# Patient Record
Sex: Male | Born: 1940 | Race: Black or African American | Hispanic: No | Marital: Married | Smoking: Former smoker
Health system: Southern US, Community
[De-identification: ages and names within clinical notes are randomized; demographics above are authoritative.]

## PROBLEM LIST (undated history)

## (undated) DIAGNOSIS — K4090 Unilateral inguinal hernia, without obstruction or gangrene, not specified as recurrent: Secondary | ICD-10-CM

## (undated) DIAGNOSIS — I10 Essential (primary) hypertension: Secondary | ICD-10-CM

## (undated) DIAGNOSIS — J449 Chronic obstructive pulmonary disease, unspecified: Secondary | ICD-10-CM

---

## 2005-12-08 ENCOUNTER — Ambulatory Visit: Payer: Self-pay | Admitting: Gastroenterology

## 2008-08-18 ENCOUNTER — Ambulatory Visit: Payer: Self-pay | Admitting: Family Medicine

## 2009-04-09 ENCOUNTER — Emergency Department: Payer: Self-pay | Admitting: Emergency Medicine

## 2012-05-23 HISTORY — PX: HERNIA REPAIR: SHX51

## 2012-11-29 ENCOUNTER — Ambulatory Visit: Payer: Self-pay | Admitting: Surgery

## 2012-12-06 ENCOUNTER — Ambulatory Visit: Payer: Self-pay | Admitting: Surgery

## 2014-09-12 NOTE — Op Note (Signed)
PATIENT NAME:  Gerald Graham, [name] MR#:  [id number] DATE OF BIRTH:  05/10/1941  DATE OF PROCEDURE:  12/06/2012  PREOPERATIVE DIAGNOSIS: Left inguinal hernia.   POSTOPERATIVE DIAGNOSIS: Left inguinal hernia.   PROCEDURE: Left inguinal hernia repair.   SURGEON:  Renda RollsWilton Henery Betzold, M.D.   ANESTHESIA: General.   INDICATIONS: This 74 year old male has a history of bulging in the left groin with increasing size. A large left inguinal scrotal hernia was demonstrated on physical exam and repair was recommended for definitive treatment.   DESCRIPTION OF PROCEDURE: The patient was placed on the operating table in the supine position under general anesthesia. The hernia was identified and was manually reduced, clippers were used. The left lower quadrant was repaired with ChloraPrep and draped in a sterile manner. A transversely oriented and left lower quadrant suprapubic incision was made, carried down through subcutaneous tissues. One bleeding point was suture ligated with 4-0 chromic. Several small bleeding points were cauterized. Scarpa's fascia was incised. The external oblique aponeurosis was incised along the course of its fibers to open the external ring and expose the inguinal cord structures. There was a large direct inguinal hernia sac which was dissected free from surrounding structures. The cremaster fibers were spread, exposing the sac and it was a somewhat tedious dissection but  the sac was approximately 5 inches in length and was dissected down through the fascial ring defect. This was just below and medial to the inferior epigastric vessels. The vessels were dissected away from the sac. The sac was opened. Its continuity with the peritoneal cavity was demonstrated. Subsequently, a high ligation of the sac was done with a 0 Surgilon ligature and suture ligature as it was triply ligated. The sac was excised. The stump was allowed to retract. Two small cord lipomas were dissected free from surrounding  structures and ligated and amputated using 4-0 Vicryl. The cord structures were further noted. There was no indirect component. Next, the repair was carried out with a row of 0 Surgilon sutures, beginning at the pubic tubercle, suturing the conjoined tendon to the shelving edge of the inguinal ligament incorporating transversalis fascia into the repair. The last stitch led to satisfactory narrowing of the internal ring. Next, an onlay Atrium mesh was cut to create an oval shape of approximately 2.8 x 4 cm and a notch was cut out to straddle the cord structures. This was placed along the floor of the inguinal canal, was sutured to the repair with 0 Surgilon.  It is also noted a relaxing incision was made medially and the mesh was sutured to the medial aspect of the relaxing incision.  It was sutured on both sides of the internal ring. The repair looked good. Hemostasis was intact. The cord structures were replaced along the floor of the inguinal canal. Cut edges of the external oblique aponeurosis were closed with running 4-0 Vicryl to recreate the external ring. The deep fascia superior and lateral to the repair site was infiltrated with 10 mL of 0.5% Sensorcaine with epinephrine. Subcutaneous tissues were infiltrated with 5 mL. Scarpa's fascia was closed with interrupted 4-0 Vicryl. The skin was closed with running 4-0 Monocryl subcuticular suture and Dermabond. The testicle remained in the scrotum. The patient tolerated the procedure satisfactorily and was then prepared for transfer to the recovery room.   ____________________________ Shela CommonsJ. Renda RollsWilton Aleeha Boline, MD jws:rw D: 12/06/2012 12:03:05 ET T: 12/06/2012 13:21:16 ET JOB#: [id number]  cc: Adella HareJ. Wilton Tarhonda Hollenberg, MD, <Dictator> Adella HareWILTON J Zienna Ahlin MD ELECTRONICALLY SIGNED 12/07/2012 9:40

## 2016-10-24 DIAGNOSIS — R0602 Shortness of breath: Secondary | ICD-10-CM | POA: Insufficient documentation

## 2017-02-20 DIAGNOSIS — K4090 Unilateral inguinal hernia, without obstruction or gangrene, not specified as recurrent: Secondary | ICD-10-CM

## 2017-02-20 HISTORY — DX: Unilateral inguinal hernia, without obstruction or gangrene, not specified as recurrent: K40.90

## 2017-03-09 ENCOUNTER — Encounter: Payer: Self-pay | Admitting: General Surgery

## 2017-03-09 ENCOUNTER — Ambulatory Visit (INDEPENDENT_AMBULATORY_CARE_PROVIDER_SITE_OTHER): Payer: Medicare Other | Admitting: General Surgery

## 2017-03-09 VITALS — BP 187/73 | HR 71 | Temp 97.8°F | Ht 63.0 in | Wt 151.4 lb

## 2017-03-09 DIAGNOSIS — K4090 Unilateral inguinal hernia, without obstruction or gangrene, not specified as recurrent: Secondary | ICD-10-CM

## 2017-03-09 NOTE — Progress Notes (Signed)
Patient ID: Gerald Graham, male   DOB: 1941/02/01, 76 y.o.   MRN: [id number]  CC: Right groin bulge  HPI Gerald Graham is a 76 y.o. male ho presents to clinic today for evaluation of a right groin bulge. Patient reports he had a near identical bulge 4 years ago that was repaired by Dr. Katrinka Blazing. He first noted this bulge about 3 months ago. It has gradually got larger and more symptomatic. He feels it whenever he is very active or strains to lift something. He states he has to hold of them when he sits down for bowel movement. It has always been soft. He denies any fevers, chills, nausea, vomiting, chest pain, shortness of breath, diarrhea, constipation. He is otherwise in his usual state of health.  HPI  Past medical history: COPD  Past surgical history: Left inguinal hernia  Past family history: Father with coronary artery disease, no other known history of heart disease, cancer, diabetes.  Social History Social History  Substance Use Topics  . Smoking status: Current Every Day Smoker    Packs/day: 0.25  . Smokeless tobacco: Never Used  . Alcohol use No    No Known Allergies  Current Outpatient Prescriptions  Medication Sig Dispense Refill  . albuterol (PROAIR HFA) 108 (90 Base) MCG/ACT inhaler 2 inhalations every 6 (six) hours as needed.    Marland Kitchen aspirin EC 81 MG tablet Take by mouth.    Marland Kitchen atorvastatin (LIPITOR) 40 MG tablet 1 tablet nightly.    Marland Kitchen doxazosin (CARDURA) 1 MG tablet Take by mouth.    Marland Kitchen lisinopril (PRINIVIL,ZESTRIL) 40 MG tablet Take by mouth.     No current facility-administered medications for this visit.      Review of Systems A multi-point review of systems was asked and was negative except for the findings documented in the history of present illness  Physical Exam Blood pressure (!) 187/73, pulse 71, temperature 97.8 F (36.6 C), temperature source Oral, height 5\' 3"  (1.6 m), weight 68.7 kg (151 lb 6.4 oz). CONSTITUTIONAL: no acute distress. EYES: Pupils are  equal, round, and reactive to light, Sclera are non-icteric. EARS, NOSE, MOUTH AND THROAT: The oropharynx is clear. The oral mucosa is pink and moist. Hearing is intact to voice. LYMPH NODES:  Lymph nodes in the neck are normal. RESPIRATORY:  Lungs are clear. There is normal respiratory effort, with equal breath sounds bilaterally, and without pathologic use of accessory muscles. CARDIOVASCULAR: Heart is regular without murmurs, gallops, or rubs. GI: The abdomen is soft, nontender, and nondistended. There is an obvious bulge to the right groin that is soft and reducible consistent with a right inguinal hernia, there are no obvious visible scars from prior surgeries.. There is no hepatosplenomegaly. There are normal bowel sounds in all quadrants. GU: Rectal deferred.   MUSCULOSKELETAL: Normal muscle strength and tone. No cyanosis or edema.   SKIN: Turgor is good and there are no pathologic skin lesions or ulcers. NEUROLOGIC: Motor and sensation is grossly normal. Cranial nerves are grossly intact. PSYCH:  Oriented to person, place and time. Affect is normal.  Data Reviewed No images and labs to review I have personally reviewed the patient's imaging, laboratory findings and medical records.    Assessment    Right inguinal hernia    Plan    76 year old male with a right inguinal hernia. Although the chart says he had a prior left inguinal hernia repair he reports that the hernias on the same side. Discussed the diagnosis in detail  as well as the treatment options of open versus laparoscopic repair. Discussed given his history of lung disease as well as the size and nature of the hernia that an open repair would probably be easier for him to handle the laparoscopic repair. Discussed the procedure in detail to include the risks, benefits, alternatives. Patient voiced understanding and desires to proceed. We will plan to proceed to the operating room on Tuesday, November 2.     Time spent with  the patient was 45 minutes, with more than 50% of the time spent in face-to-face education, counseling and care coordination.     Ricarda Frameharles Aqua Denslow, MD FACS General Surgeon 03/09/2017, 11:37 AM

## 2017-03-09 NOTE — Patient Instructions (Addendum)
You have chose to have your hernia repaired. This will be done by Dr. Tonita CongWoodham on 03/24/2017 [facility].  Please see your (blue) Pre-care information that you have been given today.  You will need to arrange to be out of work for 2 weeks and then return with a lifting restrictions for 4 more weeks. Please send any FMLA paperwork prior to surgery and we will fill this out and fax it back to your employer within 3 business days.  You may have a bruise in your groin and also swelling and brusing in your testicle area. You may use ice 4-5 times daily for 15-20 minutes each time. Make sure that you place a barrier between you and the ice pack. To decrease the swelling, you may roll up a bath towel and place it vertically in between your thighs with your testicles resting on the towel. You will want to keep this area elevated as much as possible for several days following surgery.    Inguinal Hernia, Adult Muscles help keep everything in the body in its proper place. But if a weak spot in the muscles develops, something can poke through. That is called a hernia. When this happens in the lower part of the belly (abdomen), it is called an inguinal hernia. (It takes its name from a part of the body in this region called the inguinal canal.) A weak spot in the wall of muscles lets some fat or part of the small intestine bulge through. An inguinal hernia can develop at any age. Men get them more often than women. CAUSES  In adults, an inguinal hernia develops over time.  It can be triggered by:  Suddenly straining the muscles of the lower abdomen.  Lifting heavy objects.  Straining to have a bowel movement. Difficult bowel movements (constipation) can lead to this.  Constant coughing. This may be caused by smoking or lung disease.  Being overweight.  Being pregnant.  Working at a job that requires long periods of standing or heavy lifting.  Having had an inguinal hernia before. One type can be an  emergency situation. It is called a strangulated inguinal hernia. It develops if part of the small intestine slips through the weak spot and cannot get back into the abdomen. The blood supply can be cut off. If that happens, part of the intestine may die. This situation requires emergency surgery. SYMPTOMS  Often, a small inguinal hernia has no symptoms. It is found when a healthcare provider does a physical exam. Larger hernias usually have symptoms.   In adults, symptoms may include:  A lump in the groin. This is easier to see when the person is standing. It might disappear when lying down.  In men, a lump in the scrotum.  Pain or burning in the groin. This occurs especially when lifting, straining or coughing.  A dull ache or feeling of pressure in the groin.  Signs of a strangulated hernia can include:  A bulge in the groin that becomes very painful and tender to the touch.  A bulge that turns red or purple.  Fever, nausea and vomiting.  Inability to have a bowel movement or to pass gas. DIAGNOSIS  To decide if you have an inguinal hernia, a healthcare provider will probably do a physical examination.  This will include asking questions about any symptoms you have noticed.  The healthcare provider might feel the groin area and ask you to cough. If an inguinal hernia is felt, the healthcare provider  may try to slide it back into the abdomen.  Usually no other tests are needed. TREATMENT  Treatments can vary. The size of the hernia makes a difference. Options include:  Watchful waiting. This is often suggested if the hernia is small and you have had no symptoms.  No medical procedure will be done unless symptoms develop.  You will need to watch closely for symptoms. If any occur, contact your healthcare provider right away.  Surgery. This is used if the hernia is larger or you have symptoms.  Open surgery. This is usually an outpatient procedure (you will not stay  overnight in a hospital). An cut (incision) is made through the skin in the groin. The hernia is put back inside the abdomen. The weak area in the muscles is then repaired by herniorrhaphy or hernioplasty. Herniorrhaphy: in this type of surgery, the weak muscles are sewn back together. Hernioplasty: a patch or mesh is used to close the weak area in the abdominal wall.  Laparoscopy. In this procedure, a surgeon makes small incisions. A thin tube with a tiny video camera (called a laparoscope) is put into the abdomen. The surgeon repairs the hernia with mesh by looking with the video camera and using two long instruments. HOME CARE INSTRUCTIONS   After surgery to repair an inguinal hernia:  You will need to take pain medicine prescribed by your healthcare provider. Follow all directions carefully.  You will need to take care of the wound from the incision.  Your activity will be restricted for awhile. This will probably include no heavy lifting for several weeks. You also should not do anything too active for a few weeks. When you can return to work will depend on the type of job that you have.  During "watchful waiting" periods, you should:  Maintain a healthy weight.  Eat a diet high in fiber (fruits, vegetables and whole grains).  Drink plenty of fluids to avoid constipation. This means drinking enough water and other liquids to keep your urine clear or pale yellow.  Do not lift heavy objects.  Do not stand for long periods of time.  Quit smoking. This should keep you from developing a frequent cough. SEEK MEDICAL CARE IF:   A bulge develops in your groin area.  You feel pain, a burning sensation or pressure in the groin. This might be worse if you are lifting or straining.  You develop a fever of more than 100.5 F (38.1 C). SEEK IMMEDIATE MEDICAL CARE IF:   Pain in the groin increases suddenly.  A bulge in the groin gets bigger suddenly and does not go down.  For men, there  is sudden pain in the scrotum. Or, the size of the scrotum increases.  A bulge in the groin area becomes red or purple and is painful to touch.  You have nausea or vomiting that does not go away.  You feel your heart beating much faster than normal.  You cannot have a bowel movement or pass gas.  You develop a fever of more than 102.0 F (38.9 C).   This information is not intended to replace advice given to you by your health care provider. Make sure you discuss any questions you have with your health care provider.   Document Released: 09/25/2008 Document Revised: 08/01/2011 Document Reviewed: 11/10/2014 Elsevier Interactive Patient Education [employer].

## 2017-03-13 ENCOUNTER — Telehealth: Payer: Self-pay | Admitting: General Surgery

## 2017-03-13 NOTE — Telephone Encounter (Signed)
Pt advised of pre op date/time and sx date. Sx: 03/24/17 with Dr Sedonia SmallWoodham--open right inguinal hernia repair.  Pre op: 03/14/17 @ 1:45pm--Office interview.   Patient made aware to call [phone], between 1-3:00pm the day before surgery, to find out what time to arrive.

## 2017-03-14 ENCOUNTER — Ambulatory Visit
Admission: RE | Admit: 2017-03-14 | Discharge: 2017-03-14 | Disposition: A | Discharge status: Home / Self Care | Payer: Medicare Other | Source: Ambulatory Visit | Attending: General Surgery | Admitting: General Surgery

## 2017-03-14 ENCOUNTER — Encounter
Admission: RE | Admit: 2017-03-14 | Discharge: 2017-03-14 | Disposition: A | Discharge status: Home / Self Care | Payer: Medicare Other | Source: Ambulatory Visit | Attending: General Surgery | Admitting: General Surgery

## 2017-03-14 DIAGNOSIS — Z01810 Encounter for preprocedural cardiovascular examination: Secondary | ICD-10-CM | POA: Diagnosis not present

## 2017-03-14 DIAGNOSIS — R9431 Abnormal electrocardiogram [ECG] [EKG]: Secondary | ICD-10-CM | POA: Diagnosis not present

## 2017-03-14 DIAGNOSIS — Z01818 Encounter for other preprocedural examination: Secondary | ICD-10-CM | POA: Insufficient documentation

## 2017-03-14 DIAGNOSIS — K4090 Unilateral inguinal hernia, without obstruction or gangrene, not specified as recurrent: Secondary | ICD-10-CM | POA: Insufficient documentation

## 2017-03-14 HISTORY — DX: Unilateral inguinal hernia, without obstruction or gangrene, not specified as recurrent: K40.90

## 2017-03-14 HISTORY — DX: Chronic obstructive pulmonary disease, unspecified: J44.9

## 2017-03-14 HISTORY — DX: Essential (primary) hypertension: I10

## 2017-03-14 LAB — CBC
HCT: 43 % (ref 40.0–52.0)
Hemoglobin: 14.3 g/dL (ref 13.0–18.0)
MCH: 28.1 pg (ref 26.0–34.0)
MCHC: 33.2 g/dL (ref 32.0–36.0)
MCV: 84.8 fL (ref 80.0–100.0)
PLATELETS: 166 10*3/uL (ref 150–440)
RBC: 5.08 MIL/uL (ref 4.40–5.90)
RDW: 13.8 % (ref 11.5–14.5)
WBC: 5.6 10*3/uL (ref 3.8–10.6)

## 2017-03-14 NOTE — Patient Instructions (Signed)
Your procedure is scheduled on: March 24, 2017  Report to THE [facility], 2ND FLOOR  To find out your arrival time please call [phone] between 1PM - 3PM on Thursday, March 23, 2017  Remember: Instructions that are not followed completely may result in serious medical risk, up to and including death, or upon the discretion of your surgeon and anesthesiologist your surgery may need to be rescheduled.     _X__ 1. Do not eat food after midnight the night before your procedure.                 No gum chewing or hard candies. You may drink clear liquids up to 2 hours                 before you are scheduled to arrive for your surgery- DO not drink clear                 liquids within 2 hours of the start of your surgery.                 Clear Liquids include:  water, apple juice without pulp, clear carbohydrate                 drink such as Clearfast of Gartorade, Black Coffee or Tea (Do not add                 anything to coffee or tea).     _X__ 2.  No Alcohol for 24 hours before or after surgery.   _X__ 3.  Do Not Smoke or use e-cigarettes For 24 Hours Prior to Your Surgery.                 Do not use any chewable tobacco products for at least 6 hours prior to                 surgery.  ____  4.  Bring all medications with you on the day of surgery if instructed.   _x___  5.  Notify your doctor if there is any change in your medical condition      (cold, fever, infections).     Do not wear jewelry, make-up, hairpins, clips or nail polish. Do not wear lotions, powders, or perfumes. You may wear deodorant. Do not shave 48 hours prior to surgery. Men may shave face and neck. Do not bring valuables to the hospital.    [facility] not [facility] any belongings or valuables.  Contacts, dentures or bridgework may not be worn into surgery. Leave your suitcase in the car. After surgery it may be brought to your room. For patients admitted to the  hospital, discharge time is determined by your treatment team.   Patients discharged the day of surgery will not be allowed to drive home.   Please read over the following fact sheets that you were given:   PREPARING FOR SURGERY   __X__ Take these medicines the morning of surgery with A SIP OF WATER:    1. ALBUTEROL INHALER  2. CARDURA  3.   4.  5.  6.  ____ Fleet Enema (as directed)   __X__ Use CHG Soap as directed  __X__ Use inhalers on the day of surgery  ____ Stop metformin 2 days prior to surgery    ____ Take 1/2 of usual insulin dose the night before surgery. No insulin the morning  of surgery.   __x__ Stop Coumadin/Plavix/ASPIRIN on Thursday, October 25,2018  __x__ Stop Anti-inflammatories on Thursday, October 25th.  This includes ibuprofen/motrin/aleve/advil/naproxen   ____ Stop supplements until after surgery.    ____ Bring C-Pap to the hospital.   CONTINUE TO TAKE LISINOPRIL BUT DO NOT TAKE ON THE MORNING OF SURGERY.  IF YOU COMPLETE AN ADVANCE DIRECTIVE SUCH AS HEALTHCARE POA OR LIVING WILL, PLEASE BRING IT WITH YOU SO WE MAY MAKE A COPY FOR YOUR CHART.  PURCHASE ANY KIND OF STOOL SOFTENERS SO YOU MAY BEGIN TAKING AS SOON AS YOU START NARCOTIC PAIN MEDICINE AFTER SURGERY.

## 2017-03-14 NOTE — Pre-Procedure Instructions (Signed)
Patient recently had a visit with pulmonologist (Dr. Mayo AoFlemming) who had stated that his breathing had improved, even with continued breathing. Patient denies any difficulties with his respiratory status and uses his inhaler as needed. Chest xray to be completed today prior to surgery.

## 2017-03-14 NOTE — Pre-Procedure Instructions (Signed)
Patient had stated that he had previously had a right inguinal hernia repair a few years ago but after questioning him, he is not sure of this. His wife thinks it was on his left side as all the records indicate.

## 2017-03-15 NOTE — Pre-Procedure Instructions (Signed)
CONSULTED WITH DR A KARENZ RE COPD WITH [facility]. ALSO EKG COMPARED WITH PREVIOUS AND OK BY DR Karlton LemonKARENZ

## 2017-03-23 MED ORDER — CEFAZOLIN SODIUM-DEXTROSE 2-4 GM/100ML-% IV SOLN
2.0000 g | INTRAVENOUS | Status: AC
  Administered 2017-03-24: 2 g via INTRAVENOUS

## 2017-03-24 ENCOUNTER — Ambulatory Visit
Admission: RE | Admit: 2017-03-24 | Discharge: 2017-03-24 | Disposition: A | Discharge status: Home / Self Care | Payer: Medicare Other | Source: Ambulatory Visit | Attending: General Surgery | Admitting: General Surgery

## 2017-03-24 ENCOUNTER — Encounter
Admission: RE | Disposition: A | Discharge status: Home / Self Care | Payer: Self-pay | Source: Ambulatory Visit | Attending: General Surgery

## 2017-03-24 ENCOUNTER — Ambulatory Visit: Payer: Medicare Other | Admitting: Anesthesiology

## 2017-03-24 DIAGNOSIS — F17210 Nicotine dependence, cigarettes, uncomplicated: Secondary | ICD-10-CM | POA: Diagnosis not present

## 2017-03-24 DIAGNOSIS — Z8249 Family history of ischemic heart disease and other diseases of the circulatory system: Secondary | ICD-10-CM | POA: Diagnosis not present

## 2017-03-24 DIAGNOSIS — K4090 Unilateral inguinal hernia, without obstruction or gangrene, not specified as recurrent: Secondary | ICD-10-CM | POA: Insufficient documentation

## 2017-03-24 DIAGNOSIS — Z79899 Other long term (current) drug therapy: Secondary | ICD-10-CM | POA: Insufficient documentation

## 2017-03-24 DIAGNOSIS — J449 Chronic obstructive pulmonary disease, unspecified: Secondary | ICD-10-CM | POA: Diagnosis not present

## 2017-03-24 DIAGNOSIS — I10 Essential (primary) hypertension: Secondary | ICD-10-CM | POA: Diagnosis not present

## 2017-03-24 DIAGNOSIS — Z7982 Long term (current) use of aspirin: Secondary | ICD-10-CM | POA: Diagnosis not present

## 2017-03-24 HISTORY — PX: INGUINAL HERNIA REPAIR: SHX194

## 2017-03-24 SURGERY — REPAIR, HERNIA, INGUINAL, ADULT
Anesthesia: General | Laterality: Right | Wound class: Clean

## 2017-03-24 MED ORDER — FAMOTIDINE 20 MG PO TABS
20.0000 mg | ORAL_TABLET | Freq: Once | ORAL | Status: AC
  Administered 2017-03-24: 20 mg via ORAL

## 2017-03-24 MED ORDER — ONDANSETRON HCL 4 MG/2ML IJ SOLN
INTRAMUSCULAR | Status: DC | PRN
  Administered 2017-03-24: 4 mg via INTRAVENOUS

## 2017-03-24 MED ORDER — PHENYLEPHRINE HCL 10 MG/ML IJ SOLN
INTRAMUSCULAR | Status: AC
  Filled 2017-03-24: qty 1

## 2017-03-24 MED ORDER — ROCURONIUM BROMIDE 50 MG/5ML IV SOLN
INTRAVENOUS | Status: AC
  Filled 2017-03-24: qty 1

## 2017-03-24 MED ORDER — OXYCODONE-ACETAMINOPHEN 5-325 MG PO TABS: 1.0000 | ORAL_TABLET | ORAL | 0 refills | Status: DC | PRN

## 2017-03-24 MED ORDER — PHENYLEPHRINE HCL 10 MG/ML IJ SOLN
INTRAMUSCULAR | Status: DC | PRN
  Administered 2017-03-24: 100 ug via INTRAVENOUS

## 2017-03-24 MED ORDER — ONDANSETRON HCL 4 MG/2ML IJ SOLN: 4.0000 mg | Freq: Once | INTRAMUSCULAR | Status: DC | PRN

## 2017-03-24 MED ORDER — SODIUM CHLORIDE 0.9 % IV SOLN
INTRAVENOUS | Status: DC | PRN
  Administered 2017-03-24: 40 mL

## 2017-03-24 MED ORDER — ACETAMINOPHEN 10 MG/ML IV SOLN
INTRAVENOUS | Status: AC
  Filled 2017-03-24: qty 100

## 2017-03-24 MED ORDER — ONDANSETRON HCL 4 MG/2ML IJ SOLN
INTRAMUSCULAR | Status: AC
Start: 2017-03-24 — End: 2017-03-24
  Filled 2017-03-24: qty 2

## 2017-03-24 MED ORDER — FAMOTIDINE 20 MG PO TABS
ORAL_TABLET | ORAL | Status: AC
  Administered 2017-03-24: 20 mg via ORAL
  Filled 2017-03-24: qty 1

## 2017-03-24 MED ORDER — LIDOCAINE HCL (PF) 2 % IJ SOLN
INTRAMUSCULAR | Status: AC
Start: 2017-03-24 — End: 2017-03-24
  Filled 2017-03-24: qty 10

## 2017-03-24 MED ORDER — CEFAZOLIN SODIUM-DEXTROSE 2-4 GM/100ML-% IV SOLN
INTRAVENOUS | Status: AC
  Filled 2017-03-24: qty 100

## 2017-03-24 MED ORDER — LACTATED RINGERS IV SOLN
INTRAVENOUS | Status: DC
  Administered 2017-03-24 (×2): via INTRAVENOUS

## 2017-03-24 MED ORDER — EPHEDRINE SULFATE 50 MG/ML IJ SOLN
INTRAMUSCULAR | Status: DC | PRN
  Administered 2017-03-24: 10 mg via INTRAVENOUS

## 2017-03-24 MED ORDER — LIDOCAINE HCL (CARDIAC) 20 MG/ML IV SOLN
INTRAVENOUS | Status: DC | PRN
  Administered 2017-03-24: 40 mg via INTRAVENOUS

## 2017-03-24 MED ORDER — FENTANYL CITRATE (PF) 100 MCG/2ML IJ SOLN
INTRAMUSCULAR | Status: AC
  Filled 2017-03-24: qty 2

## 2017-03-24 MED ORDER — CHLORHEXIDINE GLUCONATE CLOTH 2 % EX PADS: 6.0000 | MEDICATED_PAD | Freq: Once | CUTANEOUS | Status: DC

## 2017-03-24 MED ORDER — SODIUM CHLORIDE 0.9 % IJ SOLN
INTRAMUSCULAR | Status: AC
  Filled 2017-03-24: qty 20

## 2017-03-24 MED ORDER — PROPOFOL 10 MG/ML IV BOLUS
INTRAVENOUS | Status: DC | PRN
  Administered 2017-03-24: 120 mg via INTRAVENOUS

## 2017-03-24 MED ORDER — SEVOFLURANE IN SOLN
RESPIRATORY_TRACT | Status: AC
  Filled 2017-03-24: qty 250

## 2017-03-24 MED ORDER — GLYCOPYRROLATE 0.2 MG/ML IJ SOLN
INTRAMUSCULAR | Status: DC | PRN
  Administered 2017-03-24: 0.2 mg via INTRAVENOUS

## 2017-03-24 MED ORDER — EPHEDRINE SULFATE 50 MG/ML IJ SOLN
INTRAMUSCULAR | Status: AC
  Filled 2017-03-24: qty 1

## 2017-03-24 MED ORDER — MIDAZOLAM HCL 2 MG/2ML IJ SOLN
INTRAMUSCULAR | Status: AC
Start: 2017-03-24 — End: 2017-03-24
  Filled 2017-03-24: qty 2

## 2017-03-24 MED ORDER — BUPIVACAINE LIPOSOME 1.3 % IJ SUSP
INTRAMUSCULAR | Status: AC
  Filled 2017-03-24: qty 20

## 2017-03-24 MED ORDER — ACETAMINOPHEN 10 MG/ML IV SOLN
INTRAVENOUS | Status: DC | PRN
  Administered 2017-03-24: 1000 mg via INTRAVENOUS

## 2017-03-24 MED ORDER — FENTANYL CITRATE (PF) 100 MCG/2ML IJ SOLN
INTRAMUSCULAR | Status: DC | PRN
  Administered 2017-03-24 (×3): 25 ug via INTRAVENOUS

## 2017-03-24 MED ORDER — FENTANYL CITRATE (PF) 100 MCG/2ML IJ SOLN: 25.0000 ug | INTRAMUSCULAR | Status: DC | PRN

## 2017-03-24 MED ORDER — LIDOCAINE HCL (PF) 1 % IJ SOLN
INTRAMUSCULAR | Status: AC
Start: 2017-03-24 — End: 2017-03-24
  Filled 2017-03-24: qty 30

## 2017-03-24 MED ORDER — PROPOFOL 10 MG/ML IV BOLUS
INTRAVENOUS | Status: AC
Start: 2017-03-24 — End: 2017-03-24
  Filled 2017-03-24: qty 20

## 2017-03-24 MED ORDER — LIDOCAINE HCL 1 % IJ SOLN
INTRAMUSCULAR | Status: DC | PRN
  Administered 2017-03-24: 10 mL

## 2017-03-24 MED ORDER — MIDAZOLAM HCL 2 MG/2ML IJ SOLN: INTRAMUSCULAR | Status: DC | PRN

## 2017-03-24 MED ORDER — MIDAZOLAM HCL 2 MG/2ML IJ SOLN
INTRAMUSCULAR | Status: DC | PRN
  Administered 2017-03-24: 1 mg via INTRAVENOUS

## 2017-03-24 SURGICAL SUPPLY — 31 items
BLADE SURG 15 STRL LF DISP TIS (BLADE) ×1 IMPLANT
BLADE SURG 15 STRL SS (BLADE) ×2
CHLORAPREP W/TINT 26ML (MISCELLANEOUS) ×3 IMPLANT
DERMABOND ADVANCED (GAUZE/BANDAGES/DRESSINGS) ×2
DERMABOND ADVANCED .7 DNX12 (GAUZE/BANDAGES/DRESSINGS) ×1 IMPLANT
DRAIN PENROSE 1/4X12 LTX (DRAIN) ×3 IMPLANT
DRAPE LAPAROTOMY 100X77 ABD (DRAPES) ×3 IMPLANT
DRAPE SHEET LG 3/4 BI-LAMINATE (DRAPES) ×3 IMPLANT
ELECT CAUTERY BLADE 6.4 (BLADE) ×3 IMPLANT
ELECT REM PT RETURN 9FT ADLT (ELECTROSURGICAL) ×3
ELECTRODE REM PT RTRN 9FT ADLT (ELECTROSURGICAL) ×1 IMPLANT
GLOVE BIO SURGEON STRL SZ7.5 (GLOVE) ×3 IMPLANT
GLOVE INDICATOR 8.0 STRL GRN (GLOVE) ×3 IMPLANT
GOWN STRL REUS W/ TWL LRG LVL3 (GOWN DISPOSABLE) ×2 IMPLANT
GOWN STRL REUS W/TWL LRG LVL3 (GOWN DISPOSABLE) ×4
KIT RM TURNOVER STRD PROC AR (KITS) ×3 IMPLANT
LABEL OR SOLS (LABEL) ×3 IMPLANT
MESH PARIETEX PROGRIP RIGHT (Mesh General) ×3 IMPLANT
NDL SAFETY 25GX1.5 (NEEDLE) ×6 IMPLANT
NS IRRIG 500ML POUR BTL (IV SOLUTION) ×3 IMPLANT
PACK BASIN MINOR ARMC (MISCELLANEOUS) ×3 IMPLANT
SUT ETHIBOND 0 MO6 C/R (SUTURE) ×3 IMPLANT
SUT MNCRL 4-0 (SUTURE) ×2
SUT MNCRL 4-0 27XMFL (SUTURE) ×1
SUT SILK 2 0 SH (SUTURE) IMPLANT
SUT VIC AB 2-0 CT2 27 (SUTURE) ×6 IMPLANT
SUT VIC AB 3-0 SH 27 (SUTURE) ×2
SUT VIC AB 3-0 SH 27X BRD (SUTURE) ×1 IMPLANT
SUTURE MNCRL 4-0 27XMF (SUTURE) ×1 IMPLANT
SYR 20CC LL (SYRINGE) ×3 IMPLANT
SYRINGE 10CC LL (SYRINGE) ×3 IMPLANT

## 2017-03-24 NOTE — Transfer of Care (Signed)
Immediate Anesthesia Transfer of Care Note  Patient: Maple MirzaBobby D Grosser  Procedure(s) Performed: HERNIA REPAIR INGUINAL ADULT (Right )  Patient Location: PACU  Anesthesia Type:General  Level of Consciousness: awake  Airway & Oxygen Therapy: Patient Spontanous Breathing and Patient connected to face mask oxygen  Post-op Assessment: Report given to RN and Post -op Vital signs reviewed and stable  Post vital signs: Reviewed and stable  Last Vitals:  Vitals:   03/24/17 0809 03/24/17 1140  BP: 139/77 (!) 143/100  Pulse: (!) 18 91  Resp: 18 16  Temp: 36.9 C (!) 36.2 C  SpO2: 100% 98%    Last Pain:  Vitals:   03/24/17 0809  TempSrc: Tympanic         Complications: No apparent anesthesia complications

## 2017-03-24 NOTE — Anesthesia Preprocedure Evaluation (Addendum)
Anesthesia Evaluation  Patient identified by MRN, date of birth, ID band Patient awake    Reviewed: Allergy & Precautions, NPO status , Patient's Chart, lab work & pertinent test results  Airway Mallampati: II  TM Distance: >3 FB     Dental  (+) Upper Dentures, Lower Dentures   Pulmonary shortness of breath and with exertion, COPD,  COPD inhaler, Current Smoker,    Pulmonary exam normal        Cardiovascular hypertension, Pt. on medications Normal cardiovascular exam     Neuro/Psych negative neurological ROS  negative psych ROS   GI/Hepatic Neg liver ROS, hernia   Endo/Other  negative endocrine ROS  Renal/GU negative Renal ROS  negative genitourinary   Musculoskeletal   Abdominal Normal abdominal exam  (+)   Peds negative pediatric ROS (+)  Hematology negative hematology ROS (+)   Anesthesia Other Findings   Reproductive/Obstetrics                            Anesthesia Physical Anesthesia Plan  ASA: III  Anesthesia Plan: General   Post-op Pain Management:    Induction: Intravenous  PONV Risk Score and Plan:   Airway Management Planned: LMA and Oral ETT  Additional Equipment:   Intra-op Plan:   Post-operative Plan: Extubation in OR  Informed Consent: I have reviewed the patients History and Physical, chart, labs and discussed the procedure including the risks, benefits and alternatives for the proposed anesthesia with the patient or authorized representative who has indicated his/her understanding and acceptance.   Dental advisory given  Plan Discussed with: CRNA and Surgeon  Anesthesia Plan Comments:         Anesthesia Quick Evaluation

## 2017-03-24 NOTE — Discharge Instructions (Signed)
PATIENT INSTRUCTIONS HERNIA  FOLLOW-UP:  Please make an appointment with your physician in 1 week(s).  Call your physician immediately if you have any fevers greater than 102.5, drainage from you wound that is not clear or looks infected, persistent bleeding, increasing abdominal pain, problems urinating, or persistent nausea/vomiting.    WOUND CARE INSTRUCTIONS:  Keep a dry clean dressing on the wound if there is drainage. Once the wound has quit draining you may leave it open to air.  If clothing rubs against the wound or causes irritation and the wound is not draining you may cover it with a dry dressing during the daytime.  Try to keep the wound dry and avoid ointments on the wound unless directed to do so.  If the wound becomes bright red and painful or starts to drain infected material that is not clear, please contact your physician immediately.  If the wound is mildly pink and has a thick firm ridge underneath it, this is normal, and is referred to as a healing ridge.  This will resolve over the next 4-6 weeks.  DIET:  You may eat any foods that you can tolerate.  It is a good idea to eat a high fiber diet and take in plenty of fluids to prevent constipation.  If you do become constipated you may want to take a mild laxative or take ducolax tablets on a daily basis until your bowel habits are regular.  Constipation can be very uncomfortable, along with straining, after recent abdominal surgery.  ACTIVITY:  You are encouraged to cough and deep breath or use your incentive spirometer if you were given one, every 15-30 minutes when awake.  This will help prevent respiratory complications and low grade fevers post-operatively.  You may want to hug a pillow when coughing and sneezing to add additional support to the surgical area which will decrease pain during these times.  You are encouraged to walk and engage in light activity for the next two weeks.  You should not lift more than 20 pounds during  this time frame as it could put you at increased risk for a hernia recurrence.  Twenty pounds is roughly equivalent to a plastic bag of groceries.  It is okay to shower in 24 hours.  Do not submerge her incision. (No bathtub, hot tub, swimming pool)  MEDICATIONS:  Try to take narcotic medications and anti-inflammatory medications, such as tylenol, ibuprofen, naprosyn, etc., with food.  This will minimize stomach upset from the medication.  Should you develop nausea and vomiting from the pain medication, or develop a rash, please discontinue the medication and contact your physician.  You should not drive, make important decisions, or operate machinery when taking narcotic pain medication.  QUESTIONS:  Please feel free to call your physician or the hospital operator if you have any questions, and they will be glad to assist you.   AMBULATORY SURGERY  DISCHARGE INSTRUCTIONS   1) The drugs that you were given will stay in your system until tomorrow so for the next 24 hours you should not:  A) Drive an automobile B) Make any legal decisions C) Drink any alcoholic beverage   2) You may resume regular meals tomorrow.  Today it is better to start with liquids and gradually work up to solid foods.  You may eat anything you prefer, but it is better to start with liquids, then soup and crackers, and gradually work up to solid foods.   3) Please notify your doctor immediately  doctor immediately if you have any unusual bleeding, trouble breathing, redness and pain at the surgery site, drainage, fever, or pain not relieved by medication.    4) Additional Instructions:        Please contact your physician with any problems or Same Day Surgery at [phone], Monday through Friday 6 am to 4 pm, or [place] [facility] number at [phone].  

## 2017-03-24 NOTE — H&P (View-Only) (Signed)
Patient ID: Gerald Graham, male   DOB: 1941/02/01, 76 y.o.   MRN: [id number]  CC: Right groin bulge  HPI Gerald Graham is a 76 y.o. male ho presents to clinic today for evaluation of a right groin bulge. Patient reports he had a near identical bulge 4 years ago that was repaired by Dr. Katrinka Blazing. He first noted this bulge about 3 months ago. It has gradually got larger and more symptomatic. He feels it whenever he is very active or strains to lift something. He states he has to hold of them when he sits down for bowel movement. It has always been soft. He denies any fevers, chills, nausea, vomiting, chest pain, shortness of breath, diarrhea, constipation. He is otherwise in his usual state of health.  HPI  Past medical history: COPD  Past surgical history: Left inguinal hernia  Past family history: Father with coronary artery disease, no other known history of heart disease, cancer, diabetes.  Social History Social History  Substance Use Topics  . Smoking status: Current Every Day Smoker    Packs/day: 0.25  . Smokeless tobacco: Never Used  . Alcohol use No    No Known Allergies  Current Outpatient Prescriptions  Medication Sig Dispense Refill  . albuterol (PROAIR HFA) 108 (90 Base) MCG/ACT inhaler 2 inhalations every 6 (six) hours as needed.    Marland Kitchen aspirin EC 81 MG tablet Take by mouth.    Marland Kitchen atorvastatin (LIPITOR) 40 MG tablet 1 tablet nightly.    Marland Kitchen doxazosin (CARDURA) 1 MG tablet Take by mouth.    Marland Kitchen lisinopril (PRINIVIL,ZESTRIL) 40 MG tablet Take by mouth.     No current facility-administered medications for this visit.      Review of Systems A multi-point review of systems was asked and was negative except for the findings documented in the history of present illness  Physical Exam Blood pressure (!) 187/73, pulse 71, temperature 97.8 F (36.6 C), temperature source Oral, height 5\' 3"  (1.6 m), weight 68.7 kg (151 lb 6.4 oz). CONSTITUTIONAL: no acute distress. EYES: Pupils are  equal, round, and reactive to light, Sclera are non-icteric. EARS, NOSE, MOUTH AND THROAT: The oropharynx is clear. The oral mucosa is pink and moist. Hearing is intact to voice. LYMPH NODES:  Lymph nodes in the neck are normal. RESPIRATORY:  Lungs are clear. There is normal respiratory effort, with equal breath sounds bilaterally, and without pathologic use of accessory muscles. CARDIOVASCULAR: Heart is regular without murmurs, gallops, or rubs. GI: The abdomen is soft, nontender, and nondistended. There is an obvious bulge to the right groin that is soft and reducible consistent with a right inguinal hernia, there are no obvious visible scars from prior surgeries.. There is no hepatosplenomegaly. There are normal bowel sounds in all quadrants. GU: Rectal deferred.   MUSCULOSKELETAL: Normal muscle strength and tone. No cyanosis or edema.   SKIN: Turgor is good and there are no pathologic skin lesions or ulcers. NEUROLOGIC: Motor and sensation is grossly normal. Cranial nerves are grossly intact. PSYCH:  Oriented to person, place and time. Affect is normal.  Data Reviewed No images and labs to review I have personally reviewed the patient's imaging, laboratory findings and medical records.    Assessment    Right inguinal hernia    Plan    76 year old male with a right inguinal hernia. Although the chart says he had a prior left inguinal hernia repair he reports that the hernias on the same side. Discussed the diagnosis in detail  as well as the treatment options of open versus laparoscopic repair. Discussed given his history of lung disease as well as the size and nature of the hernia that an open repair would probably be easier for him to handle the laparoscopic repair. Discussed the procedure in detail to include the risks, benefits, alternatives. Patient voiced understanding and desires to proceed. We will plan to proceed to the operating room on Tuesday, November 2.     Time spent with  the patient was 45 minutes, with more than 50% of the time spent in face-to-face education, counseling and care coordination.     Ricarda Frameharles Damacio Weisgerber, MD FACS General Surgeon 03/09/2017, 11:37 AM

## 2017-03-24 NOTE — Anesthesia Post-op Follow-up Note (Signed)
Anesthesia QCDR form completed.        

## 2017-03-24 NOTE — Brief Op Note (Signed)
03/24/2017  11:31 AM  PATIENT:  Gerald Graham  76 y.o. male  PRE-OPERATIVE DIAGNOSIS:  RIGHT INGUINAL HERNIA  POST-OPERATIVE DIAGNOSIS:  RIGHT INGUINAL HERNIA  PROCEDURE:  Procedure(s): HERNIA REPAIR INGUINAL ADULT (Right)  SURGEON:  Surgeon(s) and Role:    * Ricarda FrameWoodham, Winry Egnew, MD - Primary  PHYSICIAN ASSISTANT:   ASSISTANTS: none   ANESTHESIA:   general  EBL:  10 mL   BLOOD ADMINISTERED:none  DRAINS: none   LOCAL MEDICATIONS USED:  OTHER exparell  SPECIMEN:  No Specimen  DISPOSITION OF SPECIMEN:  N/A  COUNTS:  YES  TOURNIQUET:  * No tourniquets in log *  DICTATION: .Dragon Dictation  PLAN OF CARE: Discharge to home after PACU  PATIENT DISPOSITION:  PACU - hemodynamically stable.   Delay start of Pharmacological VTE agent (>24hrs) due to surgical blood loss or risk of bleeding: not applicable

## 2017-03-24 NOTE — Anesthesia Postprocedure Evaluation (Signed)
Anesthesia Post Note  Patient: Gerald Graham  Procedure(s) Performed: HERNIA REPAIR INGUINAL ADULT (Right )  Patient location during evaluation: PACU Anesthesia Type: General Level of consciousness: awake and alert and oriented Pain management: pain level controlled Vital Signs Assessment: post-procedure vital signs reviewed and stable Respiratory status: spontaneous breathing Cardiovascular status: blood pressure returned to baseline Anesthetic complications: no     Last Vitals:  Vitals:   03/24/17 1215 03/24/17 1226  BP: 124/90 120/83  Pulse:  69  Resp: 17 16  Temp:  (!) 36.4 C  SpO2:  95%    Last Pain:  Vitals:   03/24/17 1200  TempSrc:   PainSc: 0-No pain                 Hopie Pellegrin

## 2017-03-24 NOTE — Interval H&P Note (Signed)
History and Physical Interval Note:  03/24/2017 8:45 AM  Gerald Graham  has presented today for surgery, with the diagnosis of RIGHT INGUINAL HERNIA  The various methods of treatment have been discussed with the patient and family. After consideration of risks, benefits and other options for treatment, the patient has consented to  Procedure(s): HERNIA REPAIR INGUINAL ADULT (Right) as a surgical intervention .  The patient's history has been reviewed, patient examined, no change in status, stable for surgery.  I have reviewed the patient's chart and labs.  Questions were answered to the patient's satisfaction.     Ricarda Frameharles Lorayne Getchell

## 2017-03-24 NOTE — Anesthesia Procedure Notes (Signed)
Procedure Name: LMA Insertion Date/Time: 03/24/2017 10:00 AM Performed by: Henrietta HooverPOPE, Shukri Nistler Pre-anesthesia Checklist: Patient identified, Emergency Drugs available, Suction available, Patient being monitored and Timeout performed Patient Re-evaluated:Patient Re-evaluated prior to induction Oxygen Delivery Method: Circle system utilized Preoxygenation: Pre-oxygenation with 100% oxygen Induction Type: IV induction Ventilation: Mask ventilation without difficulty LMA: LMA inserted LMA Size: 4.5 Number of attempts: 1 Placement Confirmation: ETT inserted through vocal cords under direct vision,  positive ETCO2 and breath sounds checked- equal and bilateral Dental Injury: Teeth and Oropharynx as per pre-operative assessment

## 2017-03-24 NOTE — Op Note (Signed)
Pre-operative Diagnosis: Right inguinal hernia   Post-operative Diagnosis: Large indirect right inguinal hernia  Procedure performed: Open right inguinal hernia repair  Surgeon: Ricarda Frame   Assistants: None  Anesthesia: General LMA anesthesia  ASA Class: 2  Surgeon: Ricarda Frame, MD FACS  Anesthesia: Gen. with endotracheal tube  Assistant: None  Procedure Details  The patient was seen again in the Holding Room. The benefits, complications, treatment options, and expected outcomes were discussed with the patient. The risks of bleeding, infection, recurrence of symptoms, failure to resolve symptoms,  bowel injury, any of which could require further surgery were reviewed with the patient.   The patient was taken to Operating Room, identified as Gerald Graham and the procedure verified.  A Time Out was held and the above information confirmed.  Prior to the induction of general anesthesia, antibiotic prophylaxis was administered. VTE prophylaxis was in place. General endotracheal anesthesia was then administered and tolerated well. After the induction, the abdomen was prepped with Chloraprep and draped in the sterile fashion. The patient was positioned in the supine position.  The procedure began with a 1% lidocaine localization of the right groin previously planned incision.  The skin was incised with a 10 blade scalpel and taken down to the level of the obvious hernia.  The hernia was dissected out until the external oblique fascia could be identified.  It was opened laterally in the direction of the fibers with a 15 blade scalpel and Metzenbaum scissors.  The very large hernia was able to be easily reduced but would immediately recur.  Using a combination of blunt dissection and electrocautery dissection the spermatic cord was dissected free and elevated isolated with a Penrose drain.  Once the spermatic cord was elevated and isolated the hernia sac was dissected free from the  cord structures.  Care was taken to protect the spermatic vessels and vas deferens throughout the entirety of the dissection.  Once the hernia sac was freed from its attachments it was able to be reduced into the abdomen.  Continued to immediately recur.  The hernia was reduced and held in place with a sponge stick while the fascial edges were reapproximated with figure-of-eight 0 Ethibond sutures.  The sutures were tied down as the sponge stick was removed allowing the hernia to remain reduced within the abdomen.  The hernia sac itself was never open during this dissection.  Once the hernia was able to be maintained in the reduced fashion the remainder of the groin was dissected out.  There was no evidence of a direct inguinal hernia.  At this point a right-sided anatomical pro-grip mesh was brought up to the field.  It was cut to the appropriate size and placed around the spermatic cord there is able to lay flat covering both the direct and indirect spaces.  It was secured to the pubic tubercle and the conjoined tendon with 2-0 Vicryl sutures.  The entire area was then copiously irrigated.  We then performed a wide field block with liposomal bupivacaine.  The external oblique fascia was then reapproximated with a running 2-0 Vicryl suture.  The Scarpa's fascia was also reapproximated with an interrupted 2-0 Vicryl suture.  The deep dermal tissue was reapproximated with interrupted 3-0 Vicryl suture.  The skin was closed with a running subcuticular 4-0 Monocryl and sealed with Dermabond.  The patient tolerated the procedure well.  All counts are correct at the end of the procedure.  He was awoken from general anesthesia and transferred  to the PACU in good condition without any immediate complication.  Findings: Large indirect right inguinal hernia   Estimated Blood Loss: 10 mL         Drains: None         Specimens: None          Complications: None                  Condition: Good   Ricarda Frameharles  Aiya Keach, MD, FACS

## 2017-03-28 ENCOUNTER — Other Ambulatory Visit: Payer: Self-pay

## 2017-03-31 ENCOUNTER — Ambulatory Visit (INDEPENDENT_AMBULATORY_CARE_PROVIDER_SITE_OTHER): Payer: Medicare Other | Admitting: General Surgery

## 2017-03-31 ENCOUNTER — Encounter: Payer: Self-pay | Admitting: General Surgery

## 2017-03-31 VITALS — BP 146/90 | HR 94 | Temp 97.5°F | Ht 63.0 in | Wt 147.8 lb

## 2017-03-31 DIAGNOSIS — Z4889 Encounter for other specified surgical aftercare: Secondary | ICD-10-CM

## 2017-03-31 NOTE — Patient Instructions (Addendum)

## 2017-03-31 NOTE — Progress Notes (Signed)
Outpatient Surgical Follow Up  03/31/2017  Judge StallBobby D Tregre is an 76 y.o. male.   Chief Complaint  Patient presents with  . Routine Post Op    Right inguinal hernia 11/2-Dr. Tonita CongWoodham    HPI: 76 year old male returns to clinic 1 week status post open right inguinal hernia repair.  Patient wanted to be evaluated for a palpable hard area in his groin since surgery.  He states he is eating well.  He denies any fevers, chills, nausea, vomiting, chest pain, shortness of breath, diarrhea, constipation.  He does have some pain to the surgical site but states that is improving.  Past Medical History:  Diagnosis Date  . COPD (chronic obstructive pulmonary disease) (HCC)    followed by dr. Mayo Aoflemming  . Hypertension   . Right inguinal hernia 02/2017    Past Surgical History:  Procedure Laterality Date  . HERNIA REPAIR Left 2014   pt states it was a right inguinal repair. records say left    History reviewed. No pertinent family history.  Social History:  reports that he has been smoking.  He has been smoking about 0.25 packs per day. he has never used smokeless tobacco. He reports that he does not drink alcohol or use drugs.  Allergies: No Known Allergies  Medications reviewed.    ROS Multipoint review of systems was completed, all pertinent positives and negatives are documented in the HPI and the remainder are negative   BP (!) 146/90   Pulse 94   Temp (!) 97.5 F (36.4 C) (Oral)   Ht 5\' 3"  (1.6 m)   Wt 67 kg (147 lb 12.8 oz)   BMI 26.18 kg/m   Physical Exam General: No acute distress Chest: Clear to auscultation Heart: Rate and rhythm Abdomen: Soft, nontender, nondistended.  Right groin incision well approximated without evidence of erythema or drainage.  There is palpable healing ridge without signs of recurrence.  Area of concern in the right hemiscrotum of the patient correlates with what appears to be a seroma.    No results found for this or any previous visit (from  the past 48 hour(s)). No results found.  Assessment/Plan:  1. Aftercare following surgery 76 year old male 1 week status post open right inguinal hernia repair.  Appears to have a seroma.  No signs of infection or recurrence on exam today.  Discussed continued lifting restrictions.  He will follow-up in clinic in 10 days for repeat wound check.     Ricarda Frameharles Gelisa Tieken, MD FACS General Surgeon  03/31/2017,12:20 PM

## 2017-04-10 ENCOUNTER — Ambulatory Visit (INDEPENDENT_AMBULATORY_CARE_PROVIDER_SITE_OTHER): Payer: Medicare Other | Admitting: General Surgery

## 2017-04-10 ENCOUNTER — Encounter: Payer: Self-pay | Admitting: General Surgery

## 2017-04-10 VITALS — BP 134/84 | HR 89 | Temp 98.3°F | Wt 147.0 lb

## 2017-04-10 DIAGNOSIS — Z4889 Encounter for other specified surgical aftercare: Secondary | ICD-10-CM

## 2017-04-10 NOTE — Patient Instructions (Signed)

## 2017-04-10 NOTE — Progress Notes (Signed)
Outpatient Surgical Follow Up  04/10/2017  Gerald Graham is an 76 y.o. male.   Chief Complaint  Patient presents with  . Routine Post Op    Right inguinal hernia 11/2-Dr. Tonita CongWoodham    HPI: 76 year old male returns to clinic for follow-up now 17 days postop from an open right inguinal repair.  The patient reports that the swelling continues to decrease.  He denies any pain.  He is eating well and having normal bowel function.  He denies any fevers, chills, nausea, vomiting, chest pain, shortness of breath.  He has been very happy with his surgical experience.  Past Medical History:  Diagnosis Date  . COPD (chronic obstructive pulmonary disease) (HCC)    followed by dr. Mayo Aoflemming  . Hypertension   . Right inguinal hernia 02/2017    Past Surgical History:  Procedure Laterality Date  . HERNIA REPAIR Left 2014   pt states it was a right inguinal repair. records say left  . HERNIA REPAIR INGUINAL ADULT Right 03/24/2017   Performed by Ricarda FrameWoodham, Samuel Mcpeek, [facility]    History reviewed. No pertinent family history.  Social History:  reports that he has been smoking.  He has been smoking about 0.25 packs per day. he has never used smokeless tobacco. He reports that he does not drink alcohol or use drugs.  Allergies: No Known Allergies  Medications reviewed.    ROS A multipoint review of systems was completed.  All pertinent positives and negatives are documented in the HPI and the remainder are negative   BP 134/84   Pulse 89   Temp 98.3 F (36.8 C) (Oral)   Wt 66.7 kg (147 lb)   BMI 26.04 kg/m   Physical Exam General: No acute distress Chest: Clear to auscultation Heart: Regular rate and rhythm Abdomen: Soft, nontender, nondistended.  Normal healing ridge below a well approximated right inguinal incision.  Some swelling to the right hemiscrotum but no impulse on Valsalva.    No results found for this or any previous visit (from the past 48 hour(s)). No results  found.  Assessment/Plan:  1. Aftercare following surgery 76 year old male status post an open right inguinal hernia for a large inguinal hernia.  Doing very well.  Discussed the signs and symptoms of infection or recurrence and to return to clinic immediately should they occur.  Otherwise discussed anticipated recovery as well as continued activity restrictions.  Patient voiced understanding and will follow up in clinic on an as-needed basis.     Ricarda Frameharles Anvika Gashi, MD FACS General Surgeon  04/10/2017,4:06 PM

## 2018-06-18 ENCOUNTER — Ambulatory Visit
Admission: RE | Admit: 2018-06-18 | Discharge: 2018-06-18 | Disposition: A | Discharge status: Home / Self Care | Payer: Medicare Other | Source: Ambulatory Visit | Attending: Family Medicine | Admitting: Family Medicine

## 2018-06-18 ENCOUNTER — Other Ambulatory Visit: Payer: Self-pay | Admitting: Family Medicine

## 2018-06-18 DIAGNOSIS — J209 Acute bronchitis, unspecified: Secondary | ICD-10-CM | POA: Diagnosis present

## 2019-11-05 DIAGNOSIS — R079 Chest pain, unspecified: Secondary | ICD-10-CM | POA: Insufficient documentation

## 2019-12-19 DIAGNOSIS — E7849 Other hyperlipidemia: Secondary | ICD-10-CM | POA: Insufficient documentation

## 2019-12-19 DIAGNOSIS — Z87891 Personal history of nicotine dependence: Secondary | ICD-10-CM | POA: Insufficient documentation

## 2019-12-19 DIAGNOSIS — I10 Essential (primary) hypertension: Secondary | ICD-10-CM | POA: Insufficient documentation

## 2020-08-06 IMAGING — CR DG CHEST 2V
2 series · 2 of 2 positions shown · non-contrast
Comparison: 03/14/2017

CLINICAL DATA: Cough x1 week

EXAM:
CHEST - 2 VIEW

[chest pa]
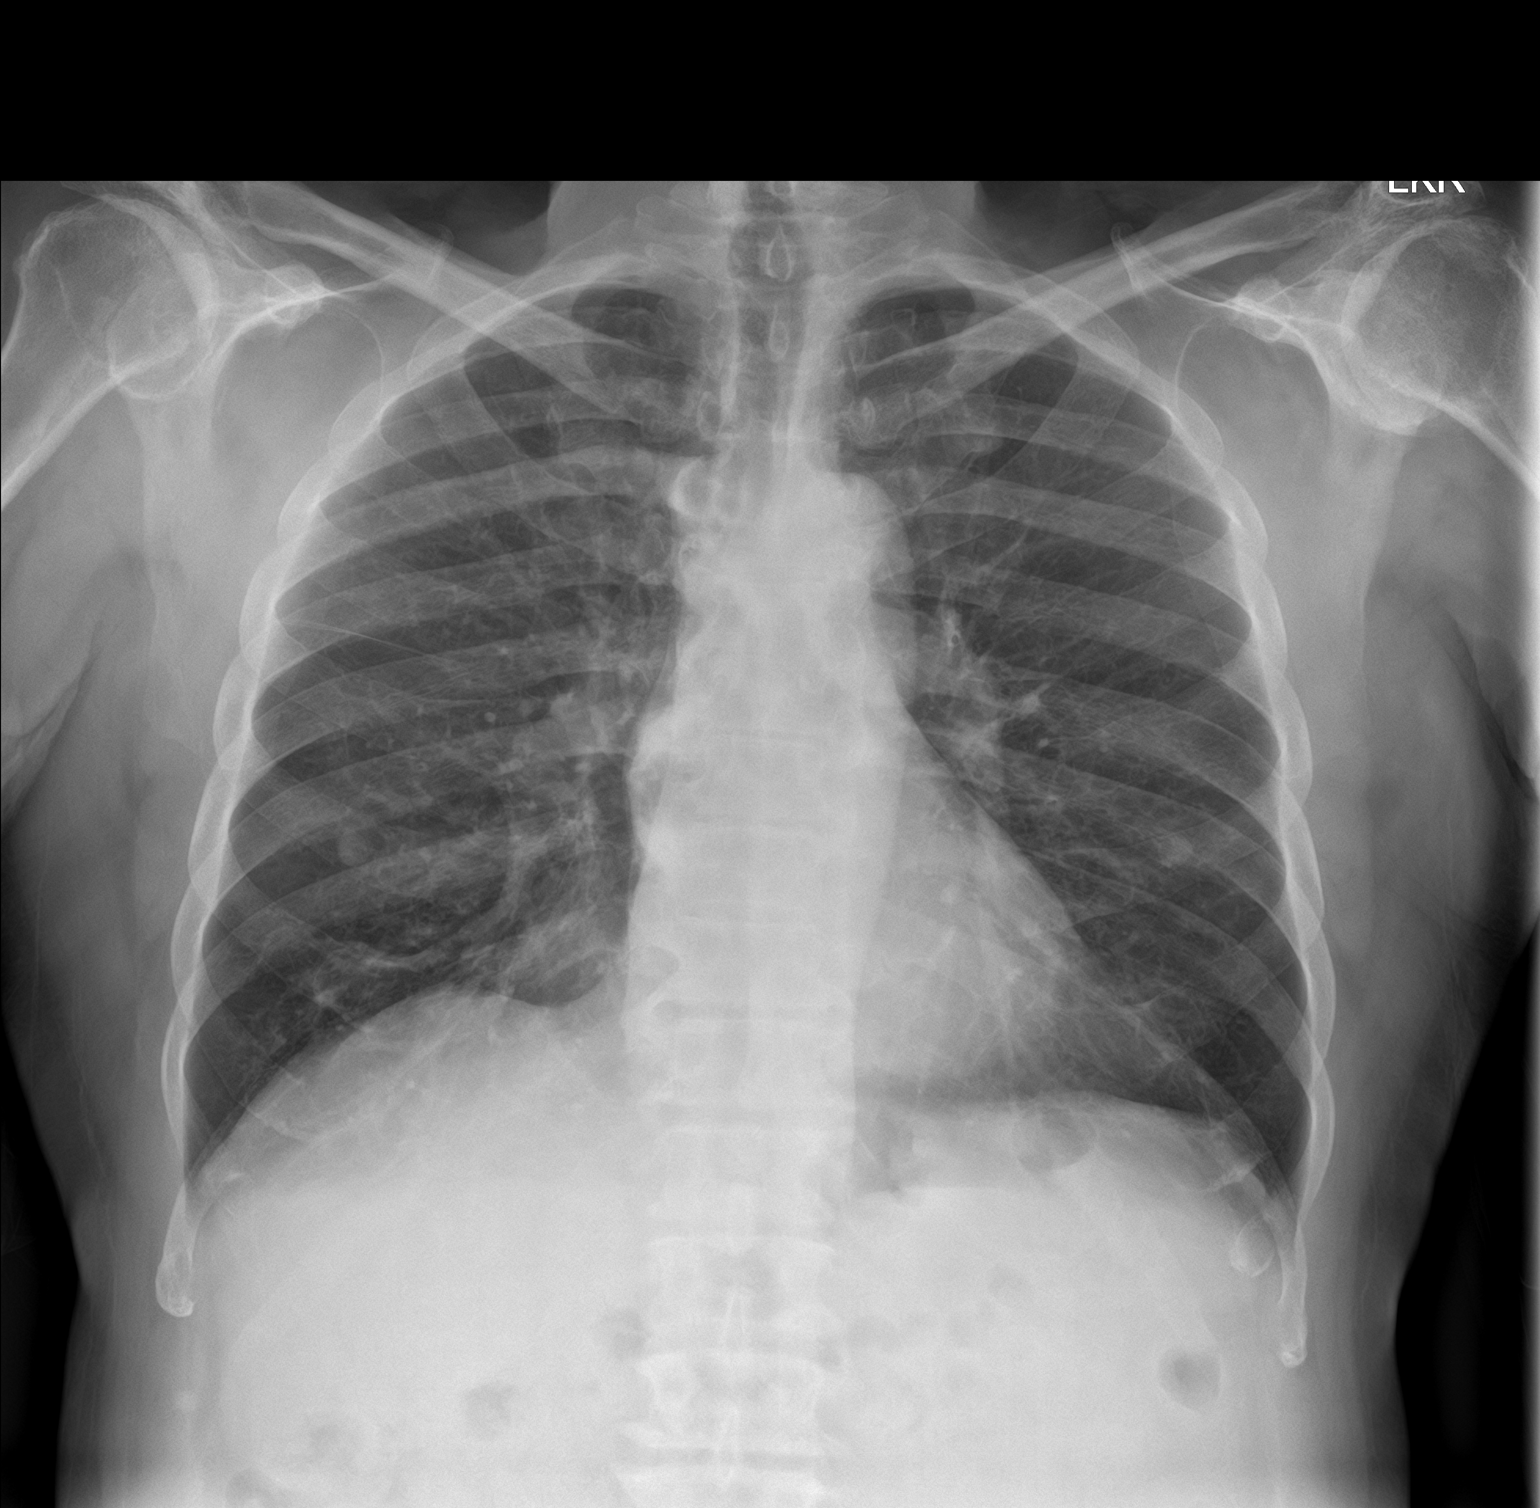

[chest lat]
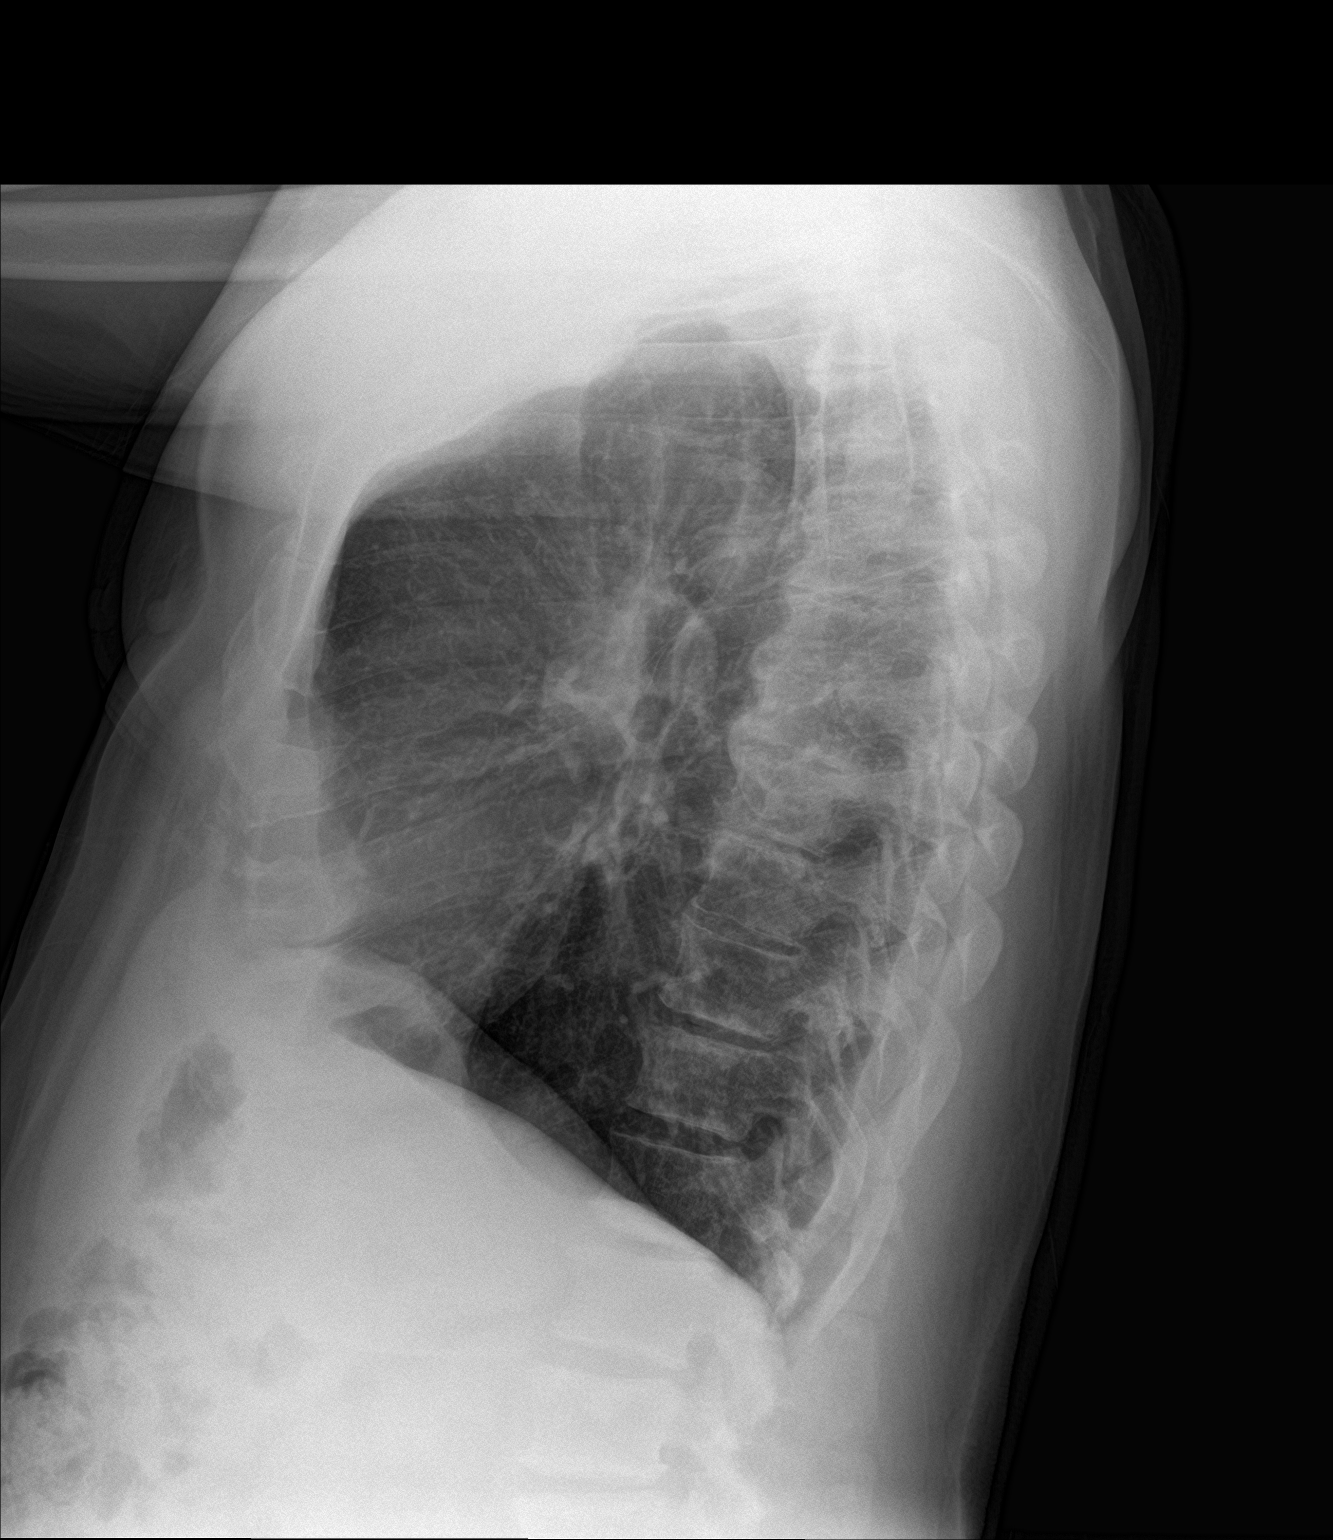

[2 of 2 positions shown; findings below may reference images not displayed]

FINDINGS: Lungs are clear.  No pleural effusion or pneumothorax.

Nipple shadows overlying the bilateral lower lungs.

The heart is normal in size.

Degenerative changes of the visualized thoracolumbar spine.
IMPRESSION: Normal chest radiographs.

## 2021-11-19 ENCOUNTER — Ambulatory Visit (INDEPENDENT_AMBULATORY_CARE_PROVIDER_SITE_OTHER): Payer: Medicare HMO | Admitting: Nurse Practitioner

## 2021-11-19 ENCOUNTER — Encounter (INDEPENDENT_AMBULATORY_CARE_PROVIDER_SITE_OTHER): Payer: Self-pay | Admitting: Nurse Practitioner

## 2021-11-19 VITALS — BP 154/92 | HR 63 | Resp 16 | Wt 148.0 lb

## 2021-11-19 DIAGNOSIS — R200 Anesthesia of skin: Secondary | ICD-10-CM

## 2021-11-19 DIAGNOSIS — I87301 Chronic venous hypertension (idiopathic) without complications of right lower extremity: Secondary | ICD-10-CM

## 2021-11-19 DIAGNOSIS — I10 Essential (primary) hypertension: Secondary | ICD-10-CM | POA: Diagnosis not present

## 2021-11-19 DIAGNOSIS — E7849 Other hyperlipidemia: Secondary | ICD-10-CM

## 2021-11-19 DIAGNOSIS — R202 Paresthesia of skin: Secondary | ICD-10-CM

## 2021-12-05 ENCOUNTER — Encounter (INDEPENDENT_AMBULATORY_CARE_PROVIDER_SITE_OTHER): Payer: Self-pay | Admitting: Nurse Practitioner

## 2021-12-05 NOTE — Progress Notes (Signed)
Subjective:    Patient ID: Gerald Graham, male    DOB: 01-08-1941, 81 y.o.   MRN: [id number] Chief Complaint  Patient presents with   New Patient (Initial Visit)    Ref Okey Dupre consult le paresthesia of skin w/intermittent area of hyperpigmentation     Gerald Graham is an 81 year old male who presents today for consult regarding paresthesias of the skin with intermittent areas of hyperpigmentation.  He is referred by Ms. Okey Dupre, NP.  The patient notes that he has numbness and tingling at random times with no pattern.  He does not there are some color changes more so on his right lower extremity they are intermittent at times.  Denies any open wounds or ulcerations.  He denies any significant edema.    Review of Systems  Cardiovascular:  Negative for leg swelling.  Skin:  Negative for wound.  Neurological:  Positive for numbness.  All other systems reviewed and are negative.      Objective:   Physical Exam Vitals reviewed.  HENT:     Head: Normocephalic.  Cardiovascular:     Rate and Rhythm: Normal rate.     Pulses:          Dorsalis pedis pulses are 1+ on the right side and 1+ on the left side.  Pulmonary:     Effort: Pulmonary effort is normal.  Skin:    General: Skin is warm and dry.  Neurological:     Mental Status: He is alert and oriented to person, place, and time.  Psychiatric:        Mood and Affect: Mood normal.        Behavior: Behavior normal.        Thought Content: Thought content normal.        Judgment: Judgment normal.     BP (!) 154/92 (BP Location: Right Arm)   Pulse 63   Resp 16   Wt 148 lb (67.1 kg)   BMI 26.22 kg/m   Past Medical History:  Diagnosis Date   COPD (chronic obstructive pulmonary disease) (HCC)    followed by dr. Mayo Ao   Hypertension    Right inguinal hernia 02/2017    Social History   Socioeconomic History   Marital status: Married    Spouse name: Not on file   Number of children: Not on file   Years of  education: Not on file   Highest education level: Not on file  Occupational History   Not on file  Tobacco Use   Smoking status: Former    Packs/day: 0.25    Types: Cigarettes   Smokeless tobacco: Never  Vaping Use   Vaping Use: Never used  Substance and Sexual Activity   Alcohol use: No   Drug use: No   Sexual activity: Never  Other Topics Concern   Not on file  Social History Narrative   Not on file   Social Determinants of Health   Financial Resource Strain: Not on file  Food Insecurity: Not on file  Transportation Needs: Not on file  Physical Activity: Not on file  Stress: Not on file  Social Connections: Not on file  Intimate Partner Violence: Not on file    Past Surgical History:  Procedure Laterality Date   HERNIA REPAIR Left 2014   pt states it was a right inguinal repair. records say left   INGUINAL HERNIA REPAIR Right 03/24/2017   Procedure: HERNIA REPAIR INGUINAL ADULT;  Surgeon: Ricarda Frame, MD;  Location: [facility]  ORS;  Service: General;  Laterality: Right;    Family History  Problem Relation Age of Onset   Cancer Mother    Heart disease Father    Heart attack Father    Cancer Brother    Diabetes Son     No Known Allergies     Latest Ref Rng & Units 03/14/2017    2:24 PM  CBC  WBC 3.8 - 10.6 K/uL 5.6   Hemoglobin 13.0 - 18.0 g/dL 85.8   Hematocrit 85.0 - 52.0 % 43.0   Platelets 150 - 440 K/uL 166       CMP  No results found for: "NA", "K", "CL", "CO2", "GLUCOSE", "BUN", "CREATININE", "CALCIUM", "PROT", "ALBUMIN", "AST", "ALT", "ALKPHOS", "BILITOT", "GFRNONAA", "GFRAA"   No results found.     Assessment & Plan:   1. Numbness and tingling of both legs The numbness and tingling on the patient's legs do not occur at consistent times.  Based on his description of symptoms suspect is more so related to neuropathy however given his previous medical history of peripheral arterial disease cannot be ruled out.  We will have the patient return  at his convenience for noninvasive studies to determine if his symptoms are related to peripheral arterial disease.  2. Hypertension, essential Continue antihypertensive medications as already ordered, these medications have been reviewed and there are no changes at this time.   3. Other hyperlipidemia Continue statin as ordered and reviewed, no changes at this time   4. Stasis edema of right lower extremity The patient does have what appears to be some stasis dermatitis.  We will evaluate for venous insufficiency.   Current Outpatient Medications on File Prior to Visit  Medication Sig Dispense Refill   albuterol (PROAIR HFA) 108 (90 Base) MCG/ACT inhaler Inhale 2 puffs into the lungs every 6 (six) hours as needed for wheezing or shortness of breath.     aspirin EC 81 MG tablet Take 81 mg by mouth daily.      atorvastatin (LIPITOR) 40 MG tablet Take 40 mg by mouth daily. Takes in the morning     doxazosin (CARDURA) 1 MG tablet Take 1 mg by mouth daily. Takes in the morning.     lisinopril (PRINIVIL,ZESTRIL) 40 MG tablet Take 40 mg by mouth daily.      naproxen sodium (ANAPROX) 220 MG tablet Take 220-440 mg by mouth 2 (two) times daily as needed (for pain/arthritis pain.).     vitamin E 1000 UNIT capsule Take 1,000 Units by mouth daily.     No current facility-administered medications on file prior to visit.    There are no Patient Instructions on file for this visit. No follow-ups on file.   Georgiana Spinner, NP

## 2022-01-11 ENCOUNTER — Other Ambulatory Visit (INDEPENDENT_AMBULATORY_CARE_PROVIDER_SITE_OTHER): Payer: Self-pay | Admitting: Nurse Practitioner

## 2022-01-11 DIAGNOSIS — R200 Anesthesia of skin: Secondary | ICD-10-CM

## 2022-01-11 DIAGNOSIS — I87301 Chronic venous hypertension (idiopathic) without complications of right lower extremity: Secondary | ICD-10-CM

## 2022-01-13 ENCOUNTER — Ambulatory Visit (INDEPENDENT_AMBULATORY_CARE_PROVIDER_SITE_OTHER): Payer: Medicare HMO

## 2022-01-13 ENCOUNTER — Encounter (INDEPENDENT_AMBULATORY_CARE_PROVIDER_SITE_OTHER): Payer: Self-pay | Admitting: Nurse Practitioner

## 2022-01-13 ENCOUNTER — Ambulatory Visit (INDEPENDENT_AMBULATORY_CARE_PROVIDER_SITE_OTHER): Payer: Medicare HMO | Admitting: Nurse Practitioner

## 2022-01-13 VITALS — BP 161/83 | HR 76 | Resp 16 | Ht 62.0 in | Wt 147.0 lb

## 2022-01-13 DIAGNOSIS — R202 Paresthesia of skin: Secondary | ICD-10-CM

## 2022-01-13 DIAGNOSIS — E7849 Other hyperlipidemia: Secondary | ICD-10-CM

## 2022-01-13 DIAGNOSIS — I87301 Chronic venous hypertension (idiopathic) without complications of right lower extremity: Secondary | ICD-10-CM | POA: Diagnosis not present

## 2022-01-13 DIAGNOSIS — I10 Essential (primary) hypertension: Secondary | ICD-10-CM | POA: Diagnosis not present

## 2022-01-13 DIAGNOSIS — R200 Anesthesia of skin: Secondary | ICD-10-CM

## 2022-02-01 ENCOUNTER — Encounter (INDEPENDENT_AMBULATORY_CARE_PROVIDER_SITE_OTHER): Payer: Self-pay | Admitting: Nurse Practitioner

## 2022-02-01 NOTE — Progress Notes (Signed)
Subjective:    Patient ID: Gerald Graham, male    DOB: [birth year], 81 y.o.   MRN: [id number] Chief Complaint  Patient presents with   Follow-up    ultrasound    Gerald Graham is an 81 year old male who presents today for consult regarding paresthesias of the skin with intermittent areas of hyperpigmentation.  He is referred by Ms. Okey Dupre, NP.  The patient notes that he has numbness and tingling at random times with no pattern.  He does not there are some color changes more so on his right lower extremity they are intermittent at times.  Denies any open wounds or ulcerations.  He denies any significant edema.  Today noninvasive study showed no evidence of DVT or superficial thrombophlebitis in the right lower extremity.  No evidence of deep venous insufficiency or superficial venous reflux.  Today the patient has an ABI of 1.13 on the right and 1.16 on the left.  Patient has a TBI of 1.09 on the right and 0.87 on the left.  He has triphasic tibial artery waveforms with normal toe waveforms bilaterally.    Review of Systems  Cardiovascular:  Positive for leg swelling.  All other systems reviewed and are negative.      Objective:   Physical Exam Vitals reviewed.  HENT:     Head: Normocephalic.  Cardiovascular:     Rate and Rhythm: Normal rate.     Pulses: Normal pulses.  Pulmonary:     Effort: Pulmonary effort is normal.  Skin:    General: Skin is warm and dry.  Neurological:     Mental Status: He is alert and oriented to person, place, and time.  Psychiatric:        Mood and Affect: Mood normal.        Behavior: Behavior normal.        Thought Content: Thought content normal.        Judgment: Judgment normal.     BP (!) 161/83 (BP Location: Left Arm)   Pulse 76   Resp 16   Ht 5\' 2"  (1.575 m)   Wt 147 lb (66.7 kg)   BMI 26.89 kg/m   Past Medical History:  Diagnosis Date   COPD (chronic obstructive pulmonary disease) (HCC)    followed by dr.    Hypertension    Right inguinal hernia 02/2017    Social History   Socioeconomic History   Marital status: Married    Spouse name: Not on file   Number of children: Not on file   Years of education: Not on file   Highest education level: Not on file  Occupational History   Not on file  Tobacco Use   Smoking status: Former    Packs/day: 0.25    Types: Cigarettes   Smokeless tobacco: Never  Vaping Use   Vaping Use: Never used  Substance and Sexual Activity   Alcohol use: No   Drug use: No   Sexual activity: Never  Other Topics Concern   Not on file  Social History Narrative   Not on file   Social Determinants of Health   Financial Resource Strain: Not on file  Food Insecurity: Not on file  Transportation Needs: Not on file  Physical Activity: Not on file  Stress: Not on file  Social Connections: Not on file  Intimate Partner Violence: Not on file    Past Surgical History:  Procedure Laterality Date   HERNIA REPAIR Left 2014   pt states  it was a right inguinal repair. records say left   INGUINAL HERNIA REPAIR Right 03/24/2017   Procedure: HERNIA REPAIR INGUINAL ADULT;  Surgeon: Ricarda Frame, MD;  Location: [facility] ORS;  Service: General;  Laterality: Right;    Family History  Problem Relation Age of Onset   Cancer Mother    Heart disease Father    Heart attack Father    Cancer Brother    Diabetes Son     No Known Allergies     Latest Ref Rng & Units 03/14/2017    2:24 PM  CBC  WBC 3.8 - 10.6 K/uL 5.6   Hemoglobin 13.0 - 18.0 g/dL 09.6   Hematocrit 28.3 - 52.0 % 43.0   Platelets 150 - 440 K/uL 166       CMP  No results found for: "NA", "K", "CL", "CO2", "GLUCOSE", "BUN", "CREATININE", "CALCIUM", "PROT", "ALBUMIN", "AST", "ALT", "ALKPHOS", "BILITOT", "GFRNONAA", "GFRAA"   VAS [place] ABI WITH/WO TBI  Result Date: 01/17/2022  LOWER EXTREMITY DOPPLER STUDY Patient Name:  DORRIEN GRUNDER  Date of Exam:   01/13/2022 Medical Rec #: [id number]      Accession  #:    [id number] Date of Birth: 04/15/1941      Patient Gender: M Patient Age:   78 years Exam Location:  [facility] Procedure:      VAS [place] ABI WITH/WO TBI Referring Phys: Vivia Birmingham Amiracle Neises --------------------------------------------------------------------------------  High Risk Factors: Hypertension, hyperlipidemia, past history of smoking.  Performing Technologist: Hardie Lora RVT  Examination Guidelines: A complete evaluation includes at minimum, Doppler waveform signals and systolic blood pressure reading at the level of bilateral brachial, anterior tibial, and posterior tibial arteries, when vessel segments are accessible. Bilateral testing is considered an integral part of a complete examination. Photoelectric Plethysmograph (PPG) waveforms and toe systolic pressure readings are included as required and additional duplex testing as needed. Limited examinations for reoccurring indications may be performed as noted.  ABI Findings: +---------+------------------+-----+---------+--------+ Right    Rt Pressure (mmHg)IndexWaveform Comment  +---------+------------------+-----+---------+--------+ Brachial 171                                      +---------+------------------+-----+---------+--------+ ATA      193               1.13 triphasic         +---------+------------------+-----+---------+--------+ PTA      190               1.11 triphasic         +---------+------------------+-----+---------+--------+ Great Toe186               1.09                   +---------+------------------+-----+---------+--------+ +---------+------------------+-----+---------+-------+ Left     Lt Pressure (mmHg)IndexWaveform Comment +---------+------------------+-----+---------+-------+ Brachial 163                                     +---------+------------------+-----+---------+-------+ ATA      198               1.16 triphasic         +---------+------------------+-----+---------+-------+ PTA      189               1.11 triphasic        +---------+------------------+-----+---------+-------+  Great Toe148               0.87                  +---------+------------------+-----+---------+-------+ +-------+-----------+-----------+------------+------------+ ABI/TBIToday's ABIToday's TBIPrevious ABIPrevious TBI +-------+-----------+-----------+------------+------------+ Right  1.13       1.09                                +-------+-----------+-----------+------------+------------+ Left   1.16       0.87                                +-------+-----------+-----------+------------+------------+  Summary: Right: Resting right ankle-brachial index is within normal range. The right toe-brachial index is normal. Left: Resting left ankle-brachial index is within normal range. The left toe-brachial index is normal. *See table(s) above for measurements and observations.  Electronically signed by Levora Dredge MD on 01/17/2022 at 5:18:27 PM.    Final        Assessment & Plan:   1. Numbness and tingling of both legs Today noninvasive studies show no evidence of DVT or superficial uvulitis.  There is also currently no evidence of peripheral arterial disease or significant arterial compromise to cause numbness and tingling in the lower extremities.  Discussed with patient possible causes of numbness and tingling such as neuropathy or possible lower back issues.  Patient will follow-up with PCP for further work-up and evaluation.  He will follow-up with [place] on an as-needed basis.  2. Hypertension, essential Continue antihypertensive medications as already ordered, these medications have been reviewed and there are no changes at this time.   3. Other hyperlipidemia Continue statin as ordered and reviewed, no changes at this time    Current Outpatient Medications on File Prior to Visit  Medication Sig Dispense Refill    albuterol (PROAIR HFA) 108 (90 Base) MCG/ACT inhaler Inhale 2 puffs into the lungs every 6 (six) hours as needed for wheezing or shortness of breath.     aspirin EC 81 MG tablet Take 81 mg by mouth daily.      atorvastatin (LIPITOR) 40 MG tablet Take 40 mg by mouth daily. Takes in the morning     cetirizine (ZYRTEC) 10 MG tablet Take 10 mg by mouth daily.     doxazosin (CARDURA) 1 MG tablet Take 1 mg by mouth daily. Takes in the morning.     lisinopril (PRINIVIL,ZESTRIL) 40 MG tablet Take 40 mg by mouth daily.      naproxen sodium (ANAPROX) 220 MG tablet Take 220-440 mg by mouth 2 (two) times daily as needed (for pain/arthritis pain.).     SPIRIVA HANDIHALER 18 MCG inhalation capsule 1 capsule daily.     vitamin E 1000 UNIT capsule Take 1,000 Units by mouth daily.     No current facility-administered medications on file prior to visit.    There are no Patient Instructions on file for this visit. No follow-ups on file.   Georgiana Spinner, NP

## 2022-07-15 ENCOUNTER — Other Ambulatory Visit: Payer: Self-pay | Admitting: Family Medicine

## 2022-07-15 DIAGNOSIS — Z13820 Encounter for screening for osteoporosis: Secondary | ICD-10-CM

## 2022-08-30 ENCOUNTER — Ambulatory Visit
Admission: RE | Admit: 2022-08-30 | Discharge: 2022-08-30 | Disposition: A | Discharge status: Home / Self Care | Payer: Medicare HMO | Source: Ambulatory Visit | Attending: Family Medicine | Admitting: Family Medicine

## 2022-08-30 DIAGNOSIS — R29890 Loss of height: Secondary | ICD-10-CM | POA: Diagnosis not present

## 2022-08-30 DIAGNOSIS — J449 Chronic obstructive pulmonary disease, unspecified: Secondary | ICD-10-CM | POA: Diagnosis not present

## 2022-08-30 DIAGNOSIS — E559 Vitamin D deficiency, unspecified: Secondary | ICD-10-CM | POA: Insufficient documentation

## 2022-08-30 DIAGNOSIS — Z13820 Encounter for screening for osteoporosis: Secondary | ICD-10-CM | POA: Diagnosis present

## 2022-08-30 DIAGNOSIS — M85852 Other specified disorders of bone density and structure, left thigh: Secondary | ICD-10-CM | POA: Diagnosis not present
